# Patient Record
Sex: Male | Born: 1950 | Race: White | Hispanic: No | Marital: Single | Smoking: Former smoker
Health system: Southern US, Community
[De-identification: ages and names within clinical notes are randomized; demographics above are authoritative.]

## PROBLEM LIST (undated history)

## (undated) DIAGNOSIS — I719 Aortic aneurysm of unspecified site, without rupture: Secondary | ICD-10-CM

## (undated) DIAGNOSIS — I639 Cerebral infarction, unspecified: Secondary | ICD-10-CM

## (undated) DIAGNOSIS — E785 Hyperlipidemia, unspecified: Secondary | ICD-10-CM

## (undated) HISTORY — PX: MANDIBLE SURGERY: SHX707

## (undated) HISTORY — DX: Aortic aneurysm of unspecified site, without rupture: I71.9

## (undated) HISTORY — PX: LEG SURGERY: SHX1003

## (undated) HISTORY — PX: OTHER SURGICAL HISTORY: SHX169

## (undated) HISTORY — DX: Cerebral infarction, unspecified: I63.9

## (undated) HISTORY — DX: Hyperlipidemia, unspecified: E78.5

---

## 2005-12-11 HISTORY — PX: AORTIC VALVE REPLACEMENT (AVR)/CORONARY ARTERY BYPASS GRAFTING (CABG): SHX5725

## 2016-09-10 DIAGNOSIS — I639 Cerebral infarction, unspecified: Secondary | ICD-10-CM

## 2016-09-10 HISTORY — DX: Cerebral infarction, unspecified: I63.9

## 2016-11-23 ENCOUNTER — Ambulatory Visit (INDEPENDENT_AMBULATORY_CARE_PROVIDER_SITE_OTHER): Payer: 59 | Admitting: Internal Medicine

## 2016-11-23 ENCOUNTER — Encounter: Payer: Self-pay | Admitting: Internal Medicine

## 2016-11-23 VITALS — BP 130/82 | HR 66 | Ht 70.0 in | Wt 195.2 lb

## 2016-11-23 DIAGNOSIS — Z79899 Other long term (current) drug therapy: Secondary | ICD-10-CM | POA: Diagnosis not present

## 2016-11-23 DIAGNOSIS — R0600 Dyspnea, unspecified: Secondary | ICD-10-CM

## 2016-11-23 DIAGNOSIS — Z951 Presence of aortocoronary bypass graft: Secondary | ICD-10-CM

## 2016-11-23 DIAGNOSIS — R5383 Other fatigue: Secondary | ICD-10-CM

## 2016-11-23 DIAGNOSIS — R0609 Other forms of dyspnea: Secondary | ICD-10-CM

## 2016-11-23 DIAGNOSIS — E785 Hyperlipidemia, unspecified: Secondary | ICD-10-CM | POA: Diagnosis not present

## 2016-11-23 DIAGNOSIS — Z01810 Encounter for preprocedural cardiovascular examination: Secondary | ICD-10-CM

## 2016-11-23 DIAGNOSIS — I714 Abdominal aortic aneurysm, without rupture, unspecified: Secondary | ICD-10-CM

## 2016-11-23 MED ORDER — CARVEDILOL 3.125 MG PO TABS: 3.1250 mg | ORAL_TABLET | Freq: Two times a day (BID) | ORAL | 3 refills | Status: AC

## 2016-11-23 NOTE — Patient Instructions (Addendum)
Your physician has requested that you have a lexiscan myoview. For further information please visit [email or web address]. Please follow instruction sheet, as given.  Your physician has recommended you make the following change in your medication:  1. START carvedilol (coreg) 3.125mg  twice daily  Your physician recommends that you return for lab work FASTING to check cholesterol, metabolic panel   Your physician recommends that you schedule a follow-up appointment after your testing

## 2016-11-24 ENCOUNTER — Encounter: Payer: Self-pay | Admitting: Internal Medicine

## 2016-11-24 DIAGNOSIS — I714 Abdominal aortic aneurysm, without rupture, unspecified: Secondary | ICD-10-CM | POA: Insufficient documentation

## 2016-11-24 DIAGNOSIS — E785 Hyperlipidemia, unspecified: Secondary | ICD-10-CM | POA: Insufficient documentation

## 2016-11-24 DIAGNOSIS — Z951 Presence of aortocoronary bypass graft: Secondary | ICD-10-CM | POA: Insufficient documentation

## 2016-11-24 DIAGNOSIS — R5383 Other fatigue: Secondary | ICD-10-CM | POA: Insufficient documentation

## 2016-11-24 DIAGNOSIS — Z79899 Other long term (current) drug therapy: Secondary | ICD-10-CM | POA: Insufficient documentation

## 2016-11-24 DIAGNOSIS — R0600 Dyspnea, unspecified: Secondary | ICD-10-CM | POA: Insufficient documentation

## 2016-11-24 DIAGNOSIS — R0609 Other forms of dyspnea: Secondary | ICD-10-CM

## 2016-11-24 DIAGNOSIS — Z01810 Encounter for preprocedural cardiovascular examination: Secondary | ICD-10-CM | POA: Insufficient documentation

## 2016-11-24 NOTE — Progress Notes (Signed)
OFFICE NOTE  Chief Complaint:  Establish cardiologist  Primary Care Physician: No primary care provider on file.  HPI:  Abundio MiuMarion Crosson is a 65 y.o. male with a very complex past medical history who was referred by one of my former partners for cardiac evaluation. He has a history of coronary artery disease status post 5 vessel CABG in 2007 which occurred at [facility] in [place]. This was after he had an acute MI. He reported he had anginal symptoms on and off prior to it which she felt were reflux and ultimately developed significant chest pain when entering the emergency department. To that, he was followed by cardiologist in [facility] [place], but has moved around a lot for work. He is currently living in [place] and presented recently with a stroke at [facility] in [place]. he underwent extensive workup including echocardiogram and Dopplers. He has a history of known thoracic and abdominal aortic aneurysms and was thought to have had some interval worsening of his abdominal aortic aneurysm. He was seen as well at [facility] and felt to require a FEVAR or open repair of his expanding AAA. Per his report, [facility] due to their expertise with this procedure. He initially saw vascular surgeons at [facility], who felt that they could not perform the procedure and referred him to Dr. Pattricia BossFarber who is ultimately planning on repairing his aneurysm. There is been no discussion that I can tell about preoperative cardiovascular testing. He has not had an assessment of his coronary bypass grafts since original surgery in 2007. So has hypertension and tobacco abuse. EKG shows sinus rhythm at 65. He is an avid horse rider and was not told to abstain from horseback riding although he is not jumping horses despite having an aneurysm greater than 7 cm of the abdominal aorta. He reports recently he's had some progressive  fatigue and shortness of breath with exertion but denies any anginal symptoms.  PMHx:  Past Medical History:  Diagnosis Date  . Aortic aneurysm (HCC)   . Hyperlipidemia   . Stroke [facility](HCC) 09/2016    Past Surgical History:  Procedure Laterality Date  . AORTIC VALVE REPLACEMENT (AVR)/CORONARY ARTERY BYPASS GRAFTING (CABG)  2007   5 bypass  . arm surgery    . LEG SURGERY    . MANDIBLE SURGERY      FAMHx:  Family History  Problem Relation Age of Onset  . Heart disease Father   . Cancer Sister     Breast    SOCHx:   reports that he has been smoking Cigarettes.  He has been smoking about 0.50 packs per day. He has never used smokeless tobacco. He reports that he does not drink alcohol or use drugs.  ALLERGIES:  Allergies  Allergen Reactions  . Atorvastatin Other (See Comments)    Myopathy    ROS: Pertinent items noted in HPI and remainder of comprehensive ROS otherwise negative.  HOME MEDS: No current outpatient prescriptions on file prior to visit.   No current facility-administered medications on file prior to visit.     LABS/IMAGING: No results found for this or any previous visit (from the past 48 hour(s)). No results found.  WEIGHTS: Wt Readings from Last 3 Encounters:  11/23/16 195 lb 3.2 oz (88.5 kg)    VITALS: BP 130/82   Pulse 66   Ht 5\' 10"  (1.778 m)   Wt 195 lb 3.2 oz (  88.5 kg)   BMI 28.01 kg/m   EXAM: General appearance: alert and no distress Neck: no carotid bruit and no JVD Lungs: clear to auscultation bilaterally Heart: regular rate and rhythm, S1, S2 normal, no murmur, click, rub or gallop Abdomen: soft, non-tender; bowel sounds normal; no masses,  no organomegaly and no pulsatile mass Extremities: extremities normal, atraumatic, no cyanosis or edema Pulses: 2+ and symmetric Skin: Skin color, texture, turgor normal. No rashes or lesions Neurologic: Grossly normal Psych: Pleasant  EKG: Normal sinus rhythm at  65  ASSESSMENT: 1. Progressive DOE and fatigue 2. CAD status post 5 vessel CABG in 2007 at [facility] [place] 3. AAA-greater than 7 cm awaiting [facility] by Dr. Pattricia BossFarber 4. Dyslipidemia 5. Recent cerebellar stroke  PLAN: 1.   Mr. Tresa EndoKelly has a history of coronary artery disease and 5 vessel CABG remotely with recent progressive dyspnea on exertion and fatigue. He has not had a significant coronary assessment and at least 3-4 years, when he had a stress echocardiogram. This was normal. I would recommend a Lexiscan Myoview which would be the safest study given his large aneurysm. He will need to have that fixed fairly quickly, however since he needs a custom graft he says that will not likely be until February. He is currently on aspirin, fish oil, a multivitamin and red yeast rice. He said atorvastatin caused him significant myalgias in the past. He will likely need additional cholesterol therapy. He also was on a beta blocker however was fatigue. That was 25 mg of metoprolol twice daily. It would be ideal for him to be back on low-dose beta blocker and I would recommend carvedilol 3.125 mg twice daily.  Plan follow-up after his stress test and I will be in contact with his vascular surgeon about surgical risk.  Chrystie NoseKenneth C. Nalanie Winiecki, MD, [facility]FACC Attending Cardiologist [facility]  Chrystie NoseKenneth C Blanche Gallien 11/24/2016, 5:55 PM

## 2016-11-28 ENCOUNTER — Telehealth (HOSPITAL_COMMUNITY): Payer: Self-pay

## 2016-11-28 NOTE — Telephone Encounter (Signed)
Encounter complete. 

## 2016-11-29 ENCOUNTER — Encounter (HOSPITAL_COMMUNITY): Payer: Medicare Other

## 2016-11-30 ENCOUNTER — Encounter: Payer: Self-pay | Admitting: Internal Medicine

## 2016-11-30 ENCOUNTER — Ambulatory Visit (HOSPITAL_COMMUNITY)
Admission: RE | Admit: 2016-11-30 | Discharge: 2016-11-30 | Disposition: A | Discharge status: Home / Self Care | Payer: 59 | Source: Ambulatory Visit | Attending: Cardiology | Admitting: Cardiology

## 2016-11-30 DIAGNOSIS — Z8249 Family history of ischemic heart disease and other diseases of the circulatory system: Secondary | ICD-10-CM | POA: Diagnosis not present

## 2016-11-30 DIAGNOSIS — E785 Hyperlipidemia, unspecified: Secondary | ICD-10-CM | POA: Diagnosis not present

## 2016-11-30 DIAGNOSIS — I252 Old myocardial infarction: Secondary | ICD-10-CM | POA: Insufficient documentation

## 2016-11-30 DIAGNOSIS — Z01810 Encounter for preprocedural cardiovascular examination: Secondary | ICD-10-CM

## 2016-11-30 DIAGNOSIS — Z87891 Personal history of nicotine dependence: Secondary | ICD-10-CM | POA: Diagnosis not present

## 2016-11-30 DIAGNOSIS — Z951 Presence of aortocoronary bypass graft: Secondary | ICD-10-CM | POA: Insufficient documentation

## 2016-11-30 DIAGNOSIS — I714 Abdominal aortic aneurysm, without rupture: Secondary | ICD-10-CM | POA: Insufficient documentation

## 2016-11-30 DIAGNOSIS — I2510 Atherosclerotic heart disease of native coronary artery without angina pectoris: Secondary | ICD-10-CM | POA: Diagnosis not present

## 2016-11-30 LAB — MYOCARDIAL PERFUSION IMAGING
CSEPPHR: 80 {beats}/min
LV dias vol: 119 mL (ref 62–150)
LVSYSVOL: 60 mL
Rest HR: 53 {beats}/min
SDS: 3
SRS: 2
SSS: 5
TID: 1.14

## 2016-11-30 MED ORDER — AMINOPHYLLINE 25 MG/ML IV SOLN
75.0000 mg | Freq: Once | INTRAVENOUS | Status: AC
  Administered 2016-11-30: 75 mg via INTRAVENOUS

## 2016-11-30 MED ORDER — TECHNETIUM TC 99M TETROFOSMIN IV KIT
32.2000 | PACK | Freq: Once | INTRAVENOUS | Status: AC | PRN
  Administered 2016-11-30: 32.2 via INTRAVENOUS
  Filled 2016-11-30: qty 33

## 2016-11-30 MED ORDER — TECHNETIUM TC 99M TETROFOSMIN IV KIT
11.0000 | PACK | Freq: Once | INTRAVENOUS | Status: AC | PRN
  Administered 2016-11-30: 11 via INTRAVENOUS
  Filled 2016-11-30: qty 11

## 2016-11-30 MED ORDER — REGADENOSON 0.4 MG/5ML IV SOLN
0.4000 mg | Freq: Once | INTRAVENOUS | Status: AC
  Administered 2016-11-30: 0.4 mg via INTRAVENOUS

## 2016-12-18 ENCOUNTER — Encounter: Payer: Self-pay | Admitting: Internal Medicine

## 2016-12-18 ENCOUNTER — Ambulatory Visit (INDEPENDENT_AMBULATORY_CARE_PROVIDER_SITE_OTHER): Payer: 59 | Admitting: Internal Medicine

## 2016-12-18 VITALS — BP 132/83 | HR 65 | Ht 70.0 in | Wt 202.2 lb

## 2016-12-18 DIAGNOSIS — E785 Hyperlipidemia, unspecified: Secondary | ICD-10-CM | POA: Diagnosis not present

## 2016-12-18 DIAGNOSIS — Z951 Presence of aortocoronary bypass graft: Secondary | ICD-10-CM | POA: Diagnosis not present

## 2016-12-18 DIAGNOSIS — Z79899 Other long term (current) drug therapy: Secondary | ICD-10-CM | POA: Diagnosis not present

## 2016-12-18 DIAGNOSIS — I714 Abdominal aortic aneurysm, without rupture, unspecified: Secondary | ICD-10-CM

## 2016-12-18 DIAGNOSIS — Z8673 Personal history of transient ischemic attack (TIA), and cerebral infarction without residual deficits: Secondary | ICD-10-CM

## 2016-12-18 MED ORDER — SIMVASTATIN 40 MG PO TABS: 40.0000 mg | ORAL_TABLET | Freq: Every day | ORAL | 3 refills | Status: DC

## 2016-12-18 MED ORDER — ROSUVASTATIN CALCIUM 20 MG PO TABS: 20.0000 mg | ORAL_TABLET | Freq: Every day | ORAL | 5 refills | Status: DC

## 2016-12-18 NOTE — Patient Instructions (Addendum)
Your physician has recommended you make the following change in your medication...  -- START simvastatin daily  -- DO NOT take red yeast rice -- take Co-Q10  Your physician recommends that you return for lab work  1. CMET - 2 weeks after starting crestor 2. LIPID - 3 months (fasting)  Your physician recommends that you schedule a follow-up appointment in: THREE MONTHS with Dr. Rennis GoldenHilty (after lab work)

## 2016-12-18 NOTE — Progress Notes (Signed)
OFFICE NOTE  Chief Complaint:  Follow-up stress test, labs  Primary Care Physician: No primary care provider on file.  HPI:  Derrick Cruz is a 66 y.o. male with a very complex past medical history who was referred by one of my former partners for cardiac evaluation. He has a history of coronary artery disease status post 5 vessel CABG in 2007 which occurred at [facility] [place]. This was after he had an acute MI. He reported he had anginal symptoms on and off prior to it which she felt were reflux and ultimately developed significant chest pain when entering the emergency department. 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 He is currently living in [place] and presented recently with a stroke at [facility] in [place], [place]. he underwent extensive workup including echocardiogram and Dopplers. He has a history of known thoracic and abdominal aortic aneurysms and was thought to have had some interval worsening of his abdominal aortic aneurysm. He was seen as well at [facility] and felt to require a FEVAR or open repair of his expanding AAA. Per his report, [facility] was [facility] due to their expertise with this procedure. He initially saw vascular surgeons at [facility], who felt that they could not perform the procedure and referred him to Dr. Pattricia BossFarber who is ultimately planning on repairing his aneurysm. There is been no discussion that I can tell about preoperative cardiovascular testing. He has not had an assessment of his coronary bypass grafts since original surgery in 2007. So has hypertension and tobacco abuse. EKG shows sinus rhythm at 65. He is an avid horse rider and was not told to abstain from horseback riding although he is not jumping horses despite having an aneurysm greater than 7 cm of the abdominal aorta. He reports recently he's had some progressive  fatigue and shortness of breath with exertion but denies any anginal symptoms.  12/18/2013  Derrick Cruz returns today for follow-up of his stress test. This indicated an LVEF of 49% with no evidence of ischemia. He is felt to be at low to intermediate risk for surgery. He is tolerating low-dose carvedilol. A recent lipid profile was performed which demonstrated total cholesterol 229, triglycerides 216, HDL 40 and LDL-C of 146. He is been intolerant to atorvastatin and most recently rosuvastatin. He is on red yeast rice. He needs greater than 50% reduction in LDL-C.  PMHx:  Past Medical History:  Diagnosis Date  . Aortic aneurysm (HCC)   . Hyperlipidemia   . Stroke [facility](HCC) 09/2016    Past Surgical History:  Procedure Laterality Date  . AORTIC VALVE REPLACEMENT (AVR)/CORONARY ARTERY BYPASS GRAFTING (CABG)  2007   5 bypass  . arm surgery    . LEG SURGERY    . MANDIBLE SURGERY      FAMHx:  Family History  Problem Relation Age of Onset  . Heart disease Father   . Cancer Sister     Breast    SOCHx:   reports that he quit smoking about 3 months ago. His smoking use included Cigarettes. He smoked 0.50 packs per day. He has never used smokeless tobacco. He reports that he does not drink alcohol or use drugs.  ALLERGIES:  Allergies  Allergen Reactions  . Atorvastatin Other (See Comments)    Myopathy    ROS: Pertinent items noted in HPI and remainder of  comprehensive ROS otherwise negative.  HOME MEDS: Current Outpatient Prescriptions on File Prior to Visit  Medication Sig Dispense Refill  . aspirin 81 MG chewable tablet Chew 81 mg by mouth daily.    . carvedilol (COREG) 3.125 MG tablet Take 1 tablet (3.125 mg total) by mouth 2 (two) times daily. 180 tablet 3  . Multiple Vitamin (MULTIVITAMIN) capsule Take 1 capsule by mouth daily.    . Omega-3 Fatty Acids (FISH OIL) 1000 MG CAPS Take 2 capsules by mouth daily.     No current facility-administered medications on file prior to  visit.     LABS/IMAGING: No results found for this or any previous visit (from the past 48 hour(s)). No results found.  WEIGHTS: Wt Readings from Last 3 Encounters:  12/18/16 202 lb 3.2 oz (91.7 kg)  11/30/16 195 lb (88.5 kg)  11/23/16 195 lb 3.2 oz (88.5 kg)    VITALS: BP 132/83 (BP Location: Right Arm, Patient Position: Sitting, Cuff Size: Normal)   Pulse 65   Ht 5\' 10"  (1.778 m)   Wt 202 lb 3.2 oz (91.7 kg)   BMI 29.01 kg/m   EXAM: Deferred  EKG: Deferred  ASSESSMENT: 1. Progressive DOE and fatigue - low risk stress test, EF 49% (11/2016) 2. CAD status post 5 vessel CABG in 2007 at [facility] in [place] 3. AAA-greater than 7 cm awaiting FEVAR at [facility] by Dr. Pattricia Boss 4. Dyslipidemia 5. Recent cerebellar stroke  PLAN: 1.   Derrick Cruz had a low risk Myoview and therefore shortness of breath and fatigue are not likely related to ischemia. He should be at low to intermediate risk for upcoming FEVAR by Dr. Pattricia Boss at [facility]. He is now on low-dose carvedilol and tolerating this without difficulty. He needs more aggressive treatment of his cholesterol as his LDL-C is 146. He reports previous intolerance to atorvastatin and rosuvastatin. I'm recommending starting simvastatin 40 mg daily. He is not on any medications that would prohibit or interact with simvastatin at this dose - FDA does not recommend dosing greater than 40 mg as a starting dose.  Plan to recheck a lipid profile in 3 months and a comprehensive metabolic profile to reassess liver enzymes in 2 weeks. Follow-up with me in 3 months.  Chrystie Nose, MD, [facility] Attending Cardiologist [facility]  Chrystie Nose 12/18/2016, 12:11 PM

## 2018-07-19 ENCOUNTER — Other Ambulatory Visit: Payer: Self-pay | Admitting: Internal Medicine

## 2018-07-19 NOTE — Telephone Encounter (Signed)
Rx request sent to pharmacy.  

## 2018-07-21 IMAGING — NM NM MISC PROCEDURE
6 series · 36 of 36 positions shown · non-contrast
Comparison: none

[Series 1: wbr_r-proj_st wbr rest · 6.40mm/px · 6 of 64 frames shown]
[frame 6/64]
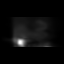
[frame 16/64]
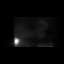
[frame 27/64]
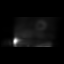
[frame 38/64]
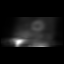
[frame 48/64]
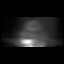
[frame 59/64]
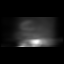

[Series 1: wbr rest · 6.40mm/px · 6 of 64 frames shown]
[frame 6/64]
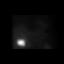
[frame 16/64]
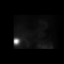
[frame 27/64]
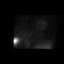
[frame 38/64]
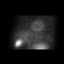
[frame 48/64]
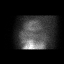
[frame 59/64]
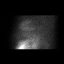

[Series 2: wbr stress-gsp · 6.40mm/px · 6 of 512 frames shown]
[frame 43/512]
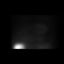
[frame 128/512]
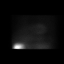
[frame 214/512]
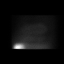
[frame 299/512]
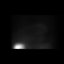
[frame 384/512]
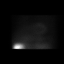
[frame 470/512]
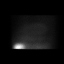

[Series 2: wbr_s-proj_st wbr stress-gsp · 6.40mm/px · 6 of 512 frames shown]
[frame 43/512]
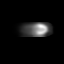
[frame 128/512]
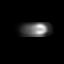
[frame 214/512]
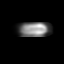
[frame 299/512]
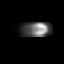
[frame 384/512]
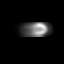
[frame 470/512]
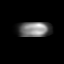

[Series 3: wbr_s-proj_st wbr stress-sum-em · 6.40mm/px · 6 of 64 frames shown]
[frame 6/64]
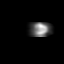
[frame 16/64]
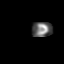
[frame 27/64]
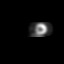
[frame 38/64]
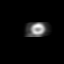
[frame 48/64]
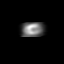
[frame 59/64]
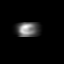

[Series 3: wbr stress-sum-em · 6.40mm/px · 6 of 64 frames shown]
[frame 6/64]
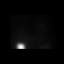
[frame 16/64]
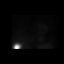
[frame 27/64]
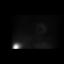
[frame 38/64]
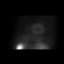
[frame 48/64]
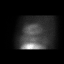
[frame 59/64]
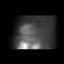

[36 of 36 positions shown; findings below may reference images not displayed]

Canned report from images found in remote index.

Refer to host system for actual result text.

## 2018-08-14 ENCOUNTER — Other Ambulatory Visit: Payer: Self-pay | Admitting: *Deleted

## 2018-08-14 MED ORDER — SIMVASTATIN 40 MG PO TABS: 40.0000 mg | ORAL_TABLET | Freq: Every day | ORAL | 0 refills | Status: DC

## 2018-08-16 ENCOUNTER — Other Ambulatory Visit: Payer: Self-pay

## 2018-08-16 MED ORDER — SIMVASTATIN 40 MG PO TABS: 40.0000 mg | ORAL_TABLET | Freq: Every day | ORAL | 3 refills | Status: AC

## 2019-05-12 ENCOUNTER — Ambulatory Visit: Payer: Medicare Other | Admitting: Neurology
